# Patient Record
Sex: Male | Born: 1990 | Race: Black or African American | Hispanic: No | Marital: Single | Smoking: Current every day smoker
Health system: Southern US, Community
[De-identification: ages and names within clinical notes are randomized; demographics above are authoritative.]

---

## 2017-01-10 ENCOUNTER — Encounter (HOSPITAL_COMMUNITY): Payer: Self-pay | Admitting: Emergency Medicine

## 2017-01-10 ENCOUNTER — Emergency Department (HOSPITAL_COMMUNITY): Payer: Self-pay

## 2017-01-10 ENCOUNTER — Other Ambulatory Visit: Payer: Self-pay

## 2017-01-10 ENCOUNTER — Emergency Department (HOSPITAL_COMMUNITY)
Admission: EM | Admit: 2017-01-10 | Discharge: 2017-01-10 | Disposition: A | Discharge status: Home / Self Care | Payer: Self-pay | Attending: Emergency Medicine | Admitting: Emergency Medicine

## 2017-01-10 DIAGNOSIS — R200 Anesthesia of skin: Secondary | ICD-10-CM | POA: Insufficient documentation

## 2017-01-10 DIAGNOSIS — Y9289 Other specified places as the place of occurrence of the external cause: Secondary | ICD-10-CM | POA: Insufficient documentation

## 2017-01-10 DIAGNOSIS — F17210 Nicotine dependence, cigarettes, uncomplicated: Secondary | ICD-10-CM | POA: Insufficient documentation

## 2017-01-10 DIAGNOSIS — Y9389 Activity, other specified: Secondary | ICD-10-CM | POA: Insufficient documentation

## 2017-01-10 DIAGNOSIS — Y998 Other external cause status: Secondary | ICD-10-CM | POA: Insufficient documentation

## 2017-01-10 DIAGNOSIS — Z23 Encounter for immunization: Secondary | ICD-10-CM | POA: Insufficient documentation

## 2017-01-10 DIAGNOSIS — S81832A Puncture wound without foreign body, left lower leg, initial encounter: Secondary | ICD-10-CM | POA: Insufficient documentation

## 2017-01-10 DIAGNOSIS — W3400XA Accidental discharge from unspecified firearms or gun, initial encounter: Secondary | ICD-10-CM

## 2017-01-10 LAB — COMPREHENSIVE METABOLIC PANEL
ALT: 32 U/L (ref 17–63)
AST: 31 U/L (ref 15–41)
Albumin: 4.2 g/dL (ref 3.5–5.0)
Alkaline Phosphatase: 68 U/L (ref 38–126)
Anion gap: 12 (ref 5–15)
BUN: 12 mg/dL (ref 6–20)
CO2: 21 mmol/L — ABNORMAL LOW (ref 22–32)
Calcium: 9.2 mg/dL (ref 8.9–10.3)
Chloride: 103 mmol/L (ref 101–111)
Creatinine, Ser: 1.32 mg/dL — ABNORMAL HIGH (ref 0.61–1.24)
GFR calc Af Amer: >60 mL/min (ref >60)
GFR calc non Af Amer: >60 mL/min (ref >60)
Glucose, Bld: 152 mg/dL — ABNORMAL HIGH (ref 65–99)
Potassium: 2.7 mmol/L — CL (ref 3.5–5.1)
Sodium: 136 mmol/L (ref 135–145)
Total Bilirubin: 0.5 mg/dL (ref 0.3–1.2)
Total Protein: 7.4 g/dL (ref 6.5–8.1)

## 2017-01-10 LAB — SAMPLE TO BLOOD BANK

## 2017-01-10 LAB — ETHANOL: Alcohol, Ethyl (B): 50 mg/dL — ABNORMAL HIGH (ref <10)

## 2017-01-10 LAB — CBC
HCT: 44.1 % (ref 39.0–52.0)
Hemoglobin: 14.7 g/dL (ref 13.0–17.0)
MCH: 30.2 pg (ref 26.0–34.0)
MCHC: 33.3 g/dL (ref 30.0–36.0)
MCV: 90.7 fL (ref 78.0–100.0)
Platelets: 259 10*3/uL (ref 150–400)
RBC: 4.86 MIL/uL (ref 4.22–5.81)
RDW: 12.7 % (ref 11.5–15.5)
WBC: 7.7 10*3/uL (ref 4.0–10.5)

## 2017-01-10 LAB — URINALYSIS, ROUTINE W REFLEX MICROSCOPIC
Bacteria, UA: NONE SEEN
Bilirubin Urine: NEGATIVE
Glucose, UA: NEGATIVE mg/dL
Ketones, ur: NEGATIVE mg/dL
Leukocytes, UA: NEGATIVE
Nitrite: NEGATIVE
Protein, ur: NEGATIVE mg/dL
Specific Gravity, Urine: >1.046 — ABNORMAL HIGH (ref 1.005–1.030)
Squamous Epithelial / LPF: NONE SEEN
pH: 6 (ref 5.0–8.0)

## 2017-01-10 LAB — I-STAT CHEM 8, ED
BUN: 13 mg/dL (ref 6–20)
CALCIUM ION: 1.07 mmol/L — AB (ref 1.15–1.40)
Chloride: 103 mmol/L (ref 101–111)
Creatinine, Ser: 1.3 mg/dL — ABNORMAL HIGH (ref 0.61–1.24)
Glucose, Bld: 154 mg/dL — ABNORMAL HIGH (ref 65–99)
HEMATOCRIT: 46 % (ref 39.0–52.0)
HEMOGLOBIN: 15.6 g/dL (ref 13.0–17.0)
Potassium: 2.7 mmol/L — CL (ref 3.5–5.1)
SODIUM: 140 mmol/L (ref 135–145)
TCO2: 21 mmol/L — AB (ref 22–32)

## 2017-01-10 LAB — PROTIME-INR
INR: 0.93
Prothrombin Time: 12.4 seconds (ref 11.4–15.2)

## 2017-01-10 LAB — CDS SEROLOGY

## 2017-01-10 LAB — I-STAT CG4 LACTIC ACID, ED: Lactic Acid, Venous: 4.02 mmol/L (ref 0.5–1.9)

## 2017-01-10 MED ORDER — HYDROCODONE-ACETAMINOPHEN 5-325 MG PO TABS: 1.0000 | ORAL_TABLET | ORAL | 0 refills | Status: AC | PRN

## 2017-01-10 MED ORDER — TETANUS-DIPHTH-ACELL PERTUSSIS 5-2.5-18.5 LF-MCG/0.5 IM SUSP
0.5000 mL | Freq: Once | INTRAMUSCULAR | Status: AC
  Administered 2017-01-10: 0.5 mL via INTRAMUSCULAR

## 2017-01-10 MED ORDER — CEFAZOLIN SODIUM-DEXTROSE 2-4 GM/100ML-% IV SOLN
2.0000 g | Freq: Once | INTRAVENOUS | Status: AC
  Administered 2017-01-10: 2 g via INTRAVENOUS

## 2017-01-10 MED ORDER — IOPAMIDOL (ISOVUE-370) INJECTION 76%
INTRAVENOUS | Status: AC
  Administered 2017-01-10: 100 mL
  Filled 2017-01-10: qty 100
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
## 2017-01-10 NOTE — Progress Notes (Signed)
Orthopedic Tech Progress Note Patient Details:  Eric Singh Eric Singh 08/02/1990 [id number]  Ortho Devices Type of Ortho Device: Ace wrap, Crutches Ortho Device/Splint Location: lle Ortho Device/Splint Interventions: Application   Post Interventions Patient Tolerated: Well Instructions Provided: Care of device   Eric Singh, Eric Singh 01/10/2017, 8:03 AM Viewed order from doctor's order list

## 2017-01-10 NOTE — ED Notes (Signed)
[employer] Detective at bedside
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
## 2017-01-10 NOTE — ED Notes (Signed)
Pt to CT via stretcher
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
